# Patient Record
Sex: Male | Born: 1965 | Race: White | Hispanic: No | Smoking: Never smoker
Health system: Southern US, Community
[De-identification: ages and names within clinical notes are randomized; demographics above are authoritative.]

---

## 2016-12-11 ENCOUNTER — Encounter: Payer: Self-pay | Admitting: Emergency Medicine

## 2016-12-11 ENCOUNTER — Other Ambulatory Visit: Payer: Self-pay

## 2016-12-11 ENCOUNTER — Emergency Department: Payer: 59

## 2016-12-11 ENCOUNTER — Emergency Department
Admission: EM | Admit: 2016-12-11 | Discharge: 2016-12-11 | Disposition: A | Discharge status: Home / Self Care | Payer: 59 | Attending: Emergency Medicine | Admitting: Emergency Medicine

## 2016-12-11 DIAGNOSIS — Y93H9 Activity, other involving exterior property and land maintenance, building and construction: Secondary | ICD-10-CM | POA: Diagnosis not present

## 2016-12-11 DIAGNOSIS — Y998 Other external cause status: Secondary | ICD-10-CM | POA: Diagnosis not present

## 2016-12-11 DIAGNOSIS — W11XXXA Fall on and from ladder, initial encounter: Secondary | ICD-10-CM | POA: Insufficient documentation

## 2016-12-11 DIAGNOSIS — S40012A Contusion of left shoulder, initial encounter: Secondary | ICD-10-CM | POA: Insufficient documentation

## 2016-12-11 DIAGNOSIS — Y92009 Unspecified place in unspecified non-institutional (private) residence as the place of occurrence of the external cause: Secondary | ICD-10-CM | POA: Diagnosis not present

## 2016-12-11 DIAGNOSIS — S4992XA Unspecified injury of left shoulder and upper arm, initial encounter: Secondary | ICD-10-CM | POA: Diagnosis present

## 2016-12-11 MED ORDER — METHOCARBAMOL 750 MG PO TABS: 750.0000 mg | ORAL_TABLET | Freq: Three times a day (TID) | ORAL | 0 refills | Status: AC | PRN

## 2016-12-11 MED ORDER — MELOXICAM 15 MG PO TABS: 15.0000 mg | ORAL_TABLET | Freq: Every day | ORAL | 0 refills | Status: AC

## 2016-12-11 NOTE — ED Triage Notes (Signed)
Larey SeatFell off ladder while working on roof, ladder slipped and he rode it down, pain L shoulder.

## 2016-12-11 NOTE — ED Provider Notes (Signed)
[facility] Emergency Department Provider Note  ____________________________________________  Time seen: Approximately 4:45 PM  I have reviewed the triage vital signs and the nursing notes.   HISTORY  Chief Complaint Shoulder Pain    HPI Raymond Rivera is a 51 y.o. male resents the emergency department complaining of right shoulder pain.  Patient reports that he was doing work on a house, was up on the top rung of the ladder stepping onto the roof when it began to slide.  Patient reports that he "rode the ladder" down towards the ground.  He tried to arrest his fall by grabbing onto the gutter and with the car off of the house.  Patient reports that he landed on his right side/shoulder region.  He denies hitting his head or losing consciousness.  Patient reports that he still has good range of motion to his left shoulder.  He had a friend look at it and states that he had some bruising to the posterior aspect of the shoulder normally.  No history of shoulder injuries.  He has had some mild intermittent numbness and tingling to the left hand.  No loss of sensation.  Patient denies any chest pain, shortness of breath, abdominal pain, nausea or vomiting.  No medications prior to arrival.  No other injury or complaint at this time.  History reviewed. No pertinent past medical history.  There are no active problems to display for this patient.   History reviewed. No pertinent surgical history.  Prior to Admission medications   Medication Sig Start Date End Date Taking? Authorizing Provider  meloxicam (MOBIC) 15 MG tablet Take 1 tablet (15 mg total) daily by mouth. 12/11/16   Anyah Swallow, Delorise Royals, PA-C  methocarbamol (ROBAXIN) 750 MG tablet Take 1 tablet (750 mg total) every 8 (eight) hours as needed by mouth for muscle spasms. 12/11/16   Luan Urbani, Delorise Royals, PA-C    Allergies Acetaminophen  No family history on file.  Social History Social History   Tobacco  Use  . Smoking status: Never Smoker  Substance Use Topics  . Alcohol use: Not on file  . Drug use: Not on file     Review of Systems  Constitutional: No fever/chills Eyes: No visual changes.  Cardiovascular: no chest pain. Respiratory: no cough. No SOB. Gastrointestinal: No abdominal pain.  No nausea, no vomiting.   Musculoskeletal: Positive for left shoulder pain Skin: Negative for rash, abrasions, lacerations, ecchymosis. Neurological: Negative for headaches, focal weakness or numbness. 10-point ROS otherwise negative.  ____________________________________________   PHYSICAL EXAM:  VITAL SIGNS: ED Triage Vitals  Enc Vitals Group     BP 12/11/16 1630 (!) 150/90     Pulse Rate 12/11/16 1630 78     Resp 12/11/16 1630 18     Temp 12/11/16 1630 98.4 F (36.9 C)     Temp Source 12/11/16 1630 Oral     SpO2 12/11/16 1630 98 %     Weight 12/11/16 1630 159 lb (72.1 kg)     Height 12/11/16 1630 5\' 9"  (1.753 m)     Head Circumference --      Peak Flow --      Pain Score 12/11/16 1629 3     Pain Loc --      Pain Edu? --      Excl. in GC? --      Constitutional: Alert and oriented. Well appearing and in no acute distress. Eyes: Conjunctivae are normal. PERRL. EOMI. Head: Atraumatic. ENT:  Ears:       Nose: No congestion/rhinnorhea.      Mouth/Throat: Mucous membranes are moist.  Neck: No stridor.  No cervical spine tenderness to palpation.  Cardiovascular: Normal rate, regular rhythm. Normal S1 and S2.  Good peripheral circulation. Respiratory: Normal respiratory effort without tachypnea or retractions. Lungs CTAB. Good air entry to the bases with no decreased or absent breath sounds. Gastrointestinal: Bowel sounds 4 quadrants. Soft and nontender to palpation. No guarding or rigidity. No palpable masses. No distention. No CVA tenderness Musculoskeletal: Full range of motion to all extremities. No gross deformities appreciated.  Mild edema and ecchymosis noted to the  posterior aspect of the left shoulder blade.  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  Patient does not have any other palpable abnormality to the left scapular region.  Patient is tender to the inferior scapular region with no palpable abnormality.  No tenderness over the scapular spine.  No tenderness to palpation over the clavicle or lateral shoulder.  Good range of motion to the left shoulder.  Examination of the cervical spine and elbows unremarkable.  Radial pulse intact.  Sensation intact all 5 digits. Neurologic:  Normal speech and language. No gross focal neurologic deficits are appreciated.  Skin:  Skin is warm, dry and intact. No rash noted. Psychiatric: Mood and affect are normal. Speech and behavior are normal. Patient exhibits appropriate insight and judgement.   ____________________________________________   LABS (all labs ordered are listed, but only abnormal results are displayed)  Labs Reviewed - No data to display ____________________________________________  EKG   ____________________________________________  RADIOLOGY Festus BarrenI, Kazandra Forstrom D Betina Puckett, personally viewed and evaluated these images (plain radiographs) as part of my medical decision making, as well as reviewing the written report by the radiologist.  Dg Shoulder Left  Result Date: 12/11/2016 CLINICAL DATA:  Pain following fall from ladder EXAM: LEFT SHOULDER - 2+ VIEW COMPARISON:  None. FINDINGS: Frontal, oblique, and Y scapular images were obtained. There is no appreciable fracture or dislocation. Joint spaces appear normal. No erosive change. Visualized left lung clear. IMPRESSION: No fracture or dislocation.  No evident arthropathy. Electronically Signed   By: Bretta BangWilliam  Woodruff III M.D.   On: 12/11/2016 17:07    ____________________________________________    PROCEDURES  Procedure(s) performed:    Procedures    Medications - No data to  display   ____________________________________________   INITIAL IMPRESSION / ASSESSMENT AND PLAN / ED COURSE  Pertinent labs & imaging results that were available during my care of the patient were reviewed by me and considered in my medical decision making (see chart for details).  Review of the [place] CSRS was performed in accordance of the [facility] prior to dispensing any controlled drugs.     Patient's diagnosis is consistent with fall from a ladder with contusion of left shoulder.  Exam was reassuring, patient's x-ray reveals no acute osseous abnormality.  Initial differential included scapular fracture, contusion, humerus fracture, cervical spine injury, head injury.  Patient did not hit his head, lose consciousness, exam is reassuring to the head and neck.  Exam and imaging is consistent with shoulder contusion.. Patient will be discharged home with prescriptions for meloxicam and Robaxin. Patient is to follow up with primary care as needed or otherwise directed. Patient is given ED precautions to return to the ED for any worsening or new symptoms.     ____________________________________________  FINAL CLINICAL IMPRESSION(S) / ED DIAGNOSES  Final diagnoses:  Fall from ladder, initial encounter  Contusion of left shoulder, initial encounter      NEW MEDICATIONS STARTED DURING THIS VISIT:  Meloxicam, 15 mg tablet daily Robaxin 750 mg tablet every 8 hours as needed      This chart was dictated using voice recognition software/Dragon. Despite best efforts to proofread, errors can occur which can change the meaning. Any change was purely unintentional.    Racheal Patches, PA-C 12/11/16 1734    Myrna Blazer, MD 12/11/16 2253

## 2018-09-30 IMAGING — DX DG SHOULDER 2+V*L*
3 series · 3 of 3 positions shown · non-contrast
Comparison: None.

CLINICAL DATA: Pain following fall from ladder

EXAM:
LEFT SHOULDER - 2+ VIEW

[shoulder ap]
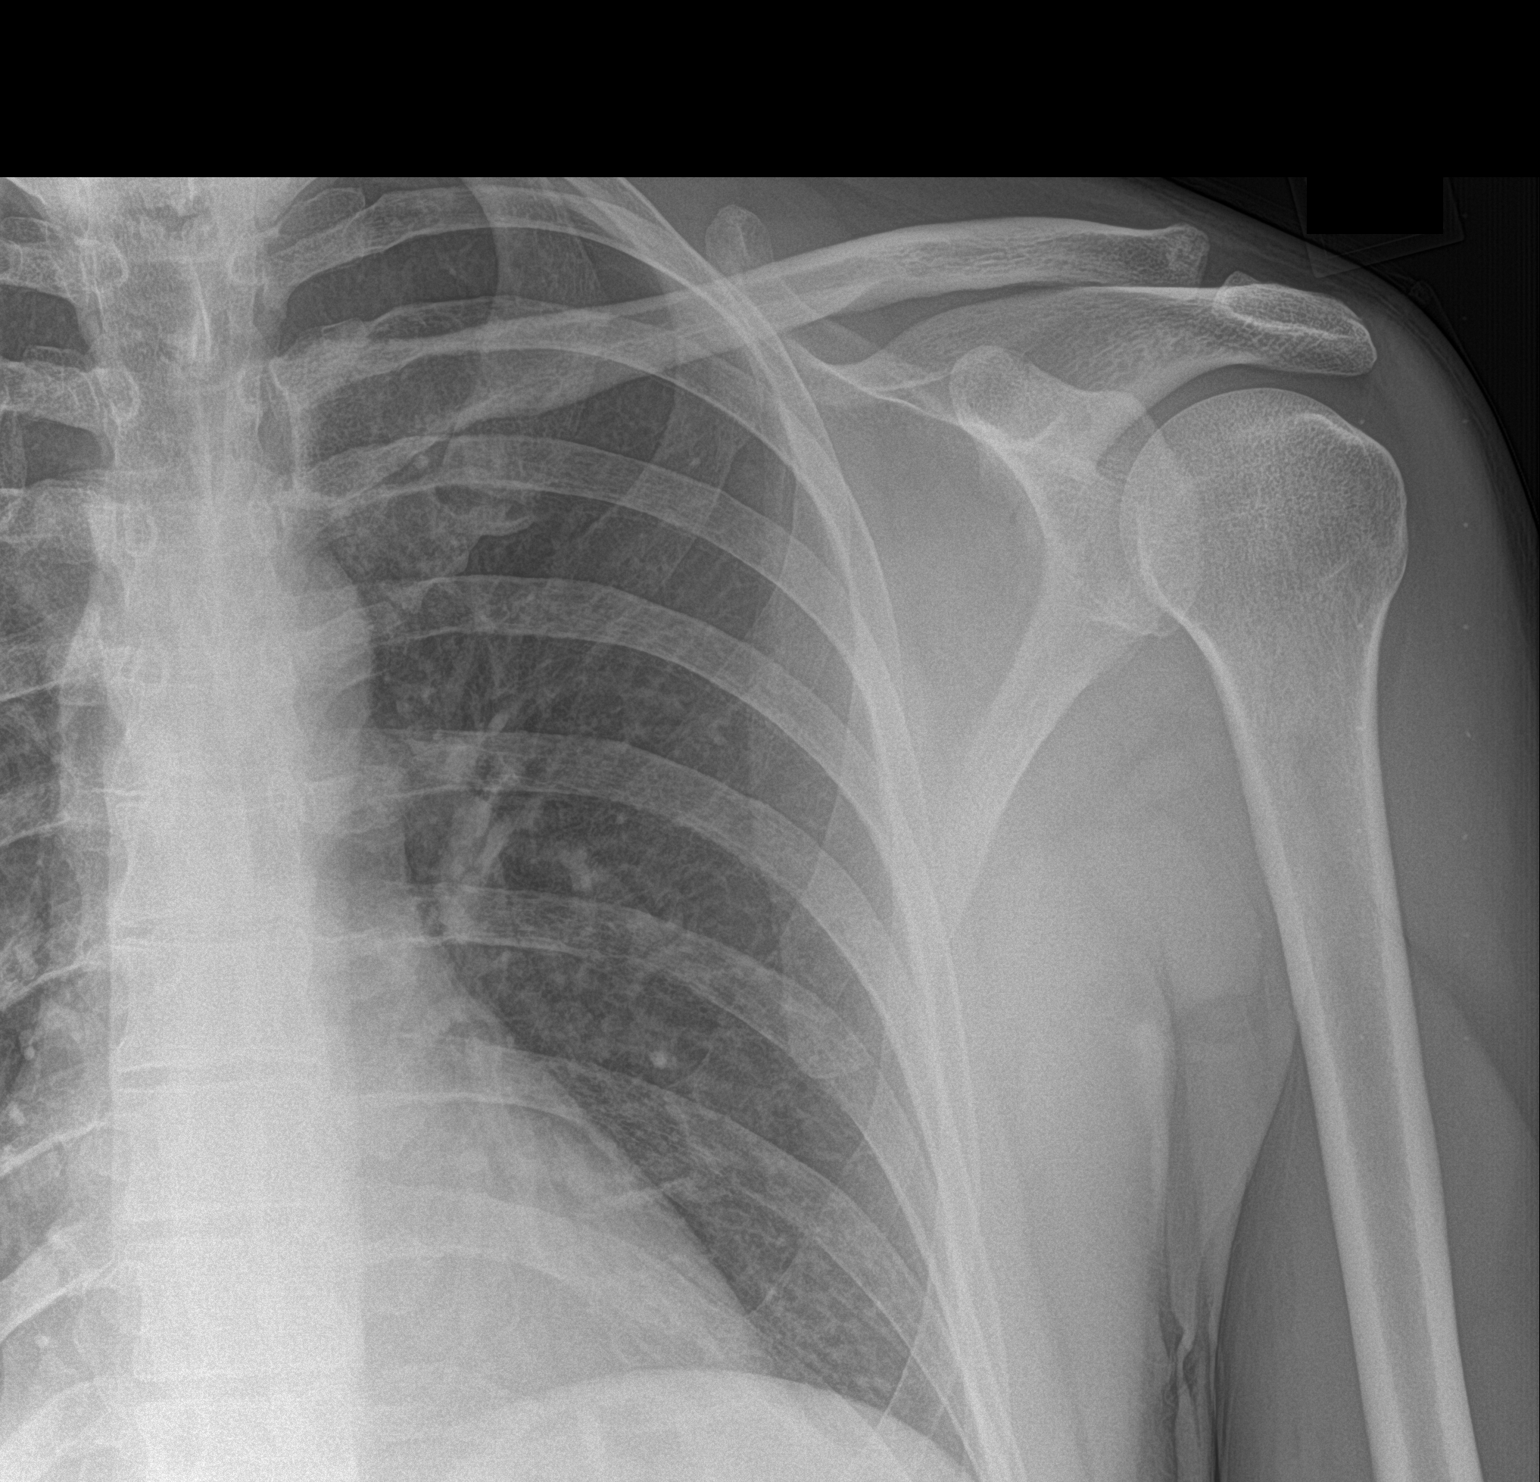

[shoulder obl (1 of 2)]
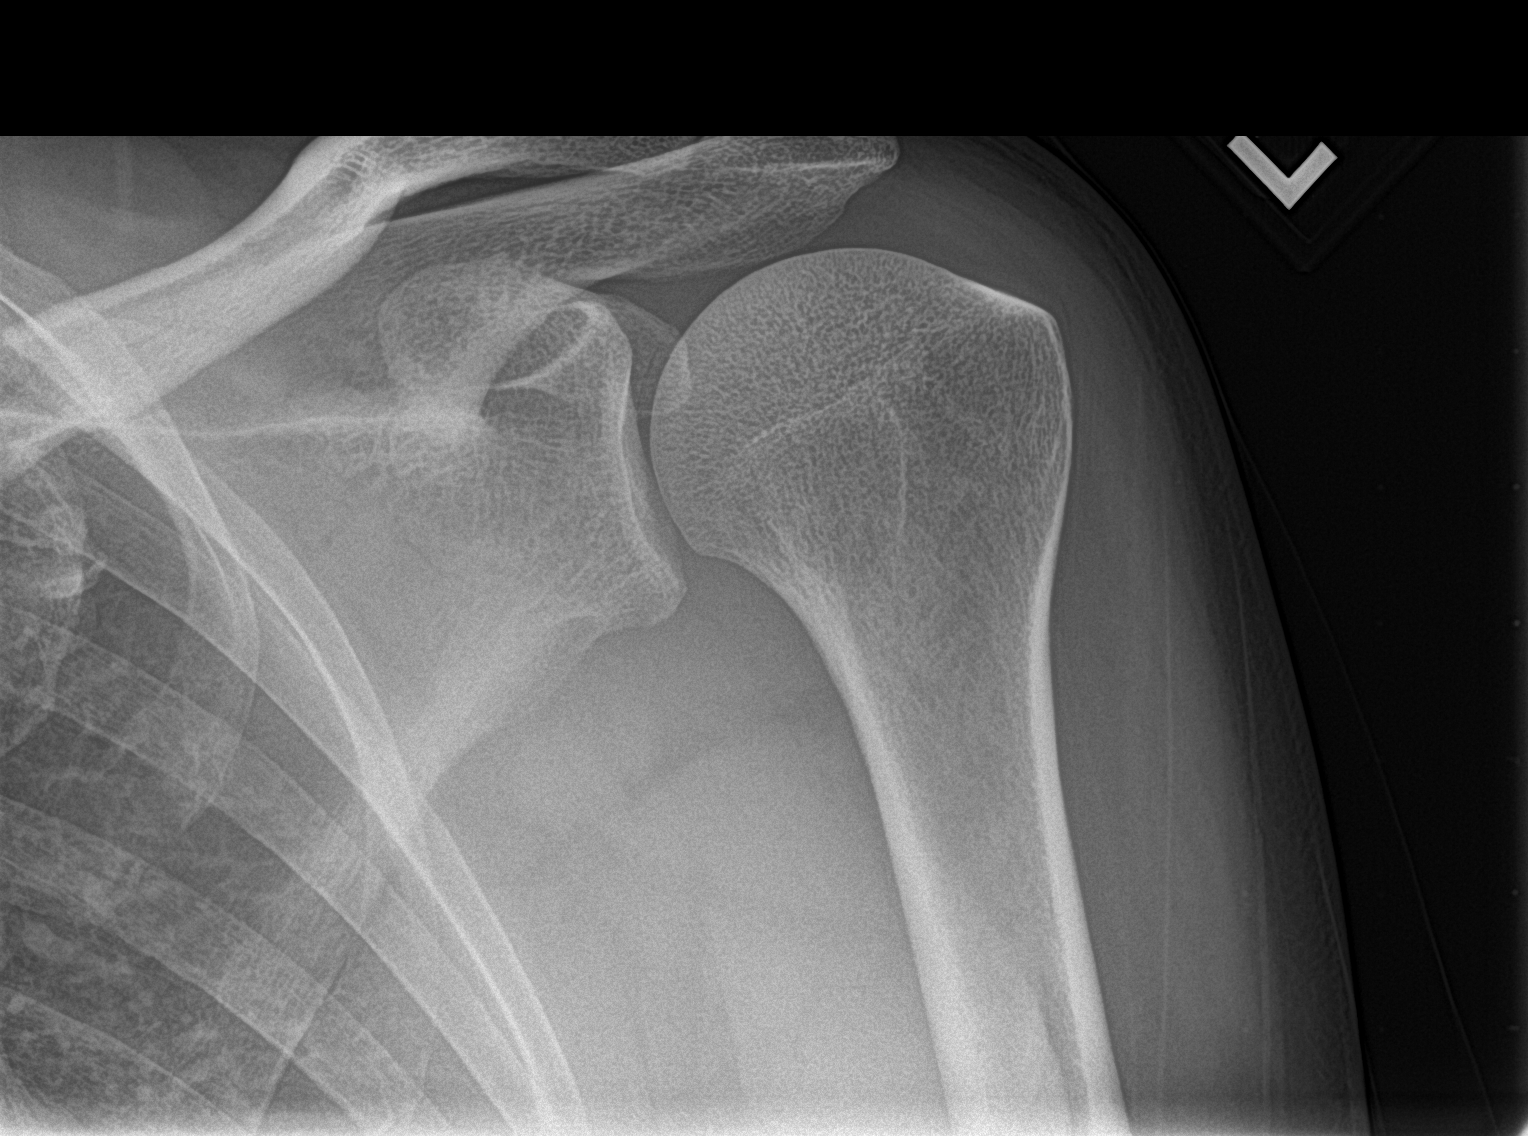

[shoulder obl (2 of 2)]
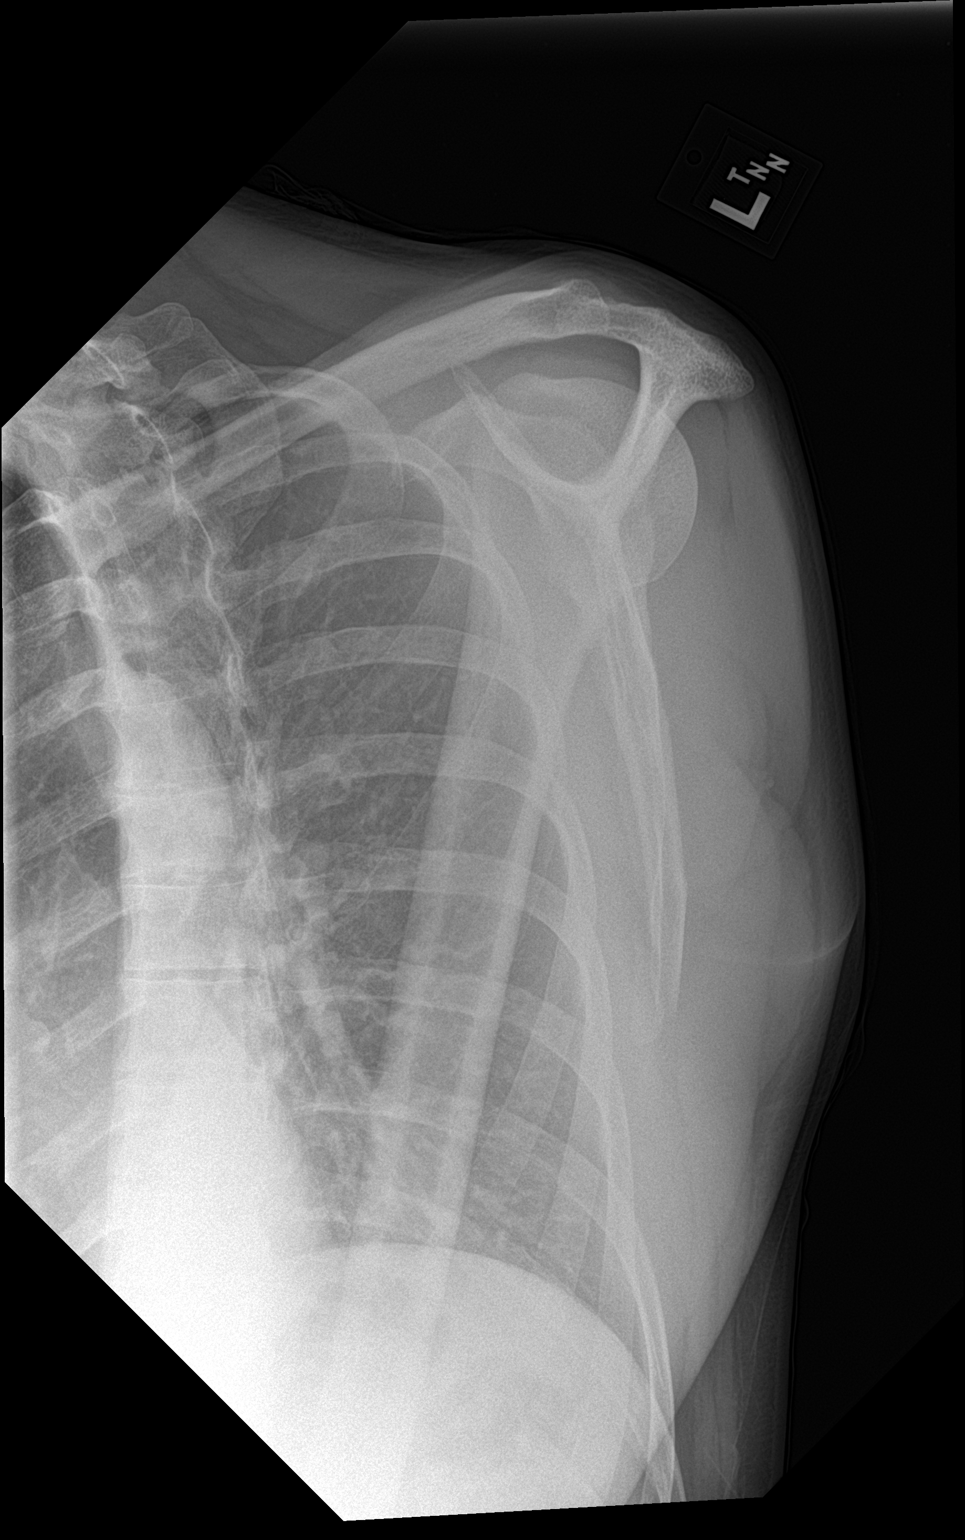

[3 of 3 positions shown; findings below may reference images not displayed]

FINDINGS: Frontal, oblique, and Y scapular images were obtained. There is no
appreciable fracture or dislocation. Joint spaces appear normal. No
erosive change. Visualized left lung clear.
IMPRESSION: No fracture or dislocation.  No evident arthropathy.

## 2019-10-16 ENCOUNTER — Other Ambulatory Visit: Payer: Self-pay | Admitting: General Surgery

## 2019-10-20 LAB — SURGICAL PATHOLOGY
